# Patient Record
Sex: Male | Born: 1980 | Race: White | Hispanic: No | Marital: Single | Smoking: Never smoker
Health system: Southern US, Community
[De-identification: ages and names within clinical notes are randomized; demographics above are authoritative.]

---

## 2008-08-26 ENCOUNTER — Ambulatory Visit (HOSPITAL_COMMUNITY): Admission: RE | Admit: 2008-08-26 | Discharge: 2008-08-26 | Payer: Self-pay | Admitting: Sports Medicine

## 2016-08-25 DIAGNOSIS — F411 Generalized anxiety disorder: Secondary | ICD-10-CM | POA: Diagnosis not present

## 2016-09-27 DIAGNOSIS — Z6828 Body mass index (BMI) 28.0-28.9, adult: Secondary | ICD-10-CM | POA: Diagnosis not present

## 2016-09-27 DIAGNOSIS — R109 Unspecified abdominal pain: Secondary | ICD-10-CM | POA: Diagnosis not present

## 2016-09-27 DIAGNOSIS — R197 Diarrhea, unspecified: Secondary | ICD-10-CM | POA: Diagnosis not present

## 2016-09-28 ENCOUNTER — Other Ambulatory Visit: Payer: Self-pay | Admitting: Internal Medicine

## 2016-09-28 ENCOUNTER — Ambulatory Visit
Admission: RE | Admit: 2016-09-28 | Discharge: 2016-09-28 | Disposition: A | Discharge status: Home / Self Care | Payer: BLUE CROSS/BLUE SHIELD | Source: Ambulatory Visit | Attending: Internal Medicine | Admitting: Internal Medicine

## 2016-09-28 DIAGNOSIS — R109 Unspecified abdominal pain: Secondary | ICD-10-CM

## 2016-09-28 MED ORDER — IOPAMIDOL (ISOVUE-300) INJECTION 61%
100.0000 mL | Freq: Once | INTRAVENOUS | Status: AC | PRN
  Administered 2016-09-28: 100 mL via INTRAVENOUS

## 2016-10-05 DIAGNOSIS — M546 Pain in thoracic spine: Secondary | ICD-10-CM | POA: Diagnosis not present

## 2016-10-05 DIAGNOSIS — M9902 Segmental and somatic dysfunction of thoracic region: Secondary | ICD-10-CM | POA: Diagnosis not present

## 2016-10-05 DIAGNOSIS — M9901 Segmental and somatic dysfunction of cervical region: Secondary | ICD-10-CM | POA: Diagnosis not present

## 2016-10-05 DIAGNOSIS — M542 Cervicalgia: Secondary | ICD-10-CM | POA: Diagnosis not present

## 2016-10-18 ENCOUNTER — Encounter (HOSPITAL_COMMUNITY): Payer: Self-pay | Admitting: Emergency Medicine

## 2016-10-18 ENCOUNTER — Emergency Department (HOSPITAL_COMMUNITY): Payer: BLUE CROSS/BLUE SHIELD

## 2016-10-18 DIAGNOSIS — R0602 Shortness of breath: Secondary | ICD-10-CM | POA: Insufficient documentation

## 2016-10-18 DIAGNOSIS — R0789 Other chest pain: Secondary | ICD-10-CM | POA: Insufficient documentation

## 2016-10-18 DIAGNOSIS — R079 Chest pain, unspecified: Secondary | ICD-10-CM | POA: Diagnosis not present

## 2016-10-18 LAB — BASIC METABOLIC PANEL
Anion gap: 13 (ref 5–15)
BUN: 10 mg/dL (ref 6–20)
CHLORIDE: 104 mmol/L (ref 101–111)
CO2: 22 mmol/L (ref 22–32)
CREATININE: 1.02 mg/dL (ref 0.61–1.24)
Calcium: 9.5 mg/dL (ref 8.9–10.3)
GFR calc Af Amer: >60 mL/min (ref >60)
GFR calc non Af Amer: >60 mL/min (ref >60)
GLUCOSE: 100 mg/dL — AB (ref 65–99)
Potassium: 3.9 mmol/L (ref 3.5–5.1)
SODIUM: 139 mmol/L (ref 135–145)

## 2016-10-18 LAB — CBC
HCT: 46.9 % (ref 39.0–52.0)
Hemoglobin: 16.4 g/dL (ref 13.0–17.0)
MCH: 30.6 pg (ref 26.0–34.0)
MCHC: 35 g/dL (ref 30.0–36.0)
MCV: 87.5 fL (ref 78.0–100.0)
PLATELETS: 163 10*3/uL (ref 150–400)
RBC: 5.36 MIL/uL (ref 4.22–5.81)
RDW: 12.5 % (ref 11.5–15.5)
WBC: 8.5 10*3/uL (ref 4.0–10.5)

## 2016-10-18 LAB — I-STAT TROPONIN, ED: Troponin i, poc: 0.01 ng/mL (ref 0.00–0.08)

## 2016-10-18 NOTE — ED Triage Notes (Signed)
Patient with sudden onset of chest pain that started this evening, increasingly getting worse, with mild shortness of breath.  Patient denies any nausea or vomiting, describes the pain a sharp, stabbing.  Patient states that it started at rest.  Radiates to left arm.

## 2016-10-19 ENCOUNTER — Emergency Department (HOSPITAL_COMMUNITY)
Admission: EM | Admit: 2016-10-19 | Discharge: 2016-10-19 | Disposition: A | Discharge status: Home / Self Care | Payer: BLUE CROSS/BLUE SHIELD | Attending: Emergency Medicine | Admitting: Emergency Medicine

## 2016-10-19 DIAGNOSIS — R0789 Other chest pain: Secondary | ICD-10-CM

## 2016-10-19 LAB — I-STAT TROPONIN, ED: Troponin i, poc: 0 ng/mL (ref 0.00–0.08)

## 2016-10-19 MED ORDER — IBUPROFEN 600 MG PO TABS: 600.0000 mg | ORAL_TABLET | Freq: Four times a day (QID) | ORAL | 0 refills | Status: AC | PRN

## 2016-10-19 NOTE — ED Provider Notes (Signed)
[facility]-EMERGENCY DEPT Provider Note   CSN: [id number] Arrival date & time: 10/18/16  2246  By signing my name below, I, Diona BrownerJennifer Gorman, attest that this documentation has been prepared under the direction and in the presence of Derwood KaplanNanavati, Jorene Kaylor, MD. Electronically Signed: Diona BrownerJennifer Gorman, ED Scribe. 10/19/16. 4:28 AM.  History   Chief Complaint Chief Complaint  Patient presents with  . Chest Pain  . Shortness of Breath    HPI Donald Williams is a 36 y.o. male who presents to the Emergency Department complaining of sudden, gradually worsening, stabbing, left sided chest pain that started ~ 10 pm on 10/18/16. Pt was walking up the stairs when his pain started. Deep breathes exacerbate his discomfort. Fingers and toes are numb. Pain radiates to his left arm and neck. Pt currently rates pain as 3/10 but it was originally 8/10 severity when onset started. Has had this pain one time before. FHx of heart disease. No hx of blood clots. No recent travel. No recent injuries. Pt denies associated nausea, vomiting, diaphoresis, and SOB.  The history is provided by the patient. No language interpreter was used.    History reviewed. No pertinent past medical history.  There are no active problems to display for this patient.   History reviewed. No pertinent surgical history.     Home Medications    Prior to Admission medications   Medication Sig Start Date End Date Taking? Authorizing Provider  esomeprazole (NEXIUM) 40 MG capsule Take 40 mg by mouth daily at 12 noon.   Yes [provider]    Family History No family history on file.  Social History Social History  Substance Use Topics  . Smoking status: Never Smoker  . Smokeless tobacco: Never Used  . Alcohol use Yes     Allergies   Penicillins   Review of Systems Review of Systems  Constitutional: Negative for diaphoresis.  Respiratory: Negative for shortness of breath.   Cardiovascular: Positive for chest  pain.  Gastrointestinal: Negative for nausea and vomiting.  Musculoskeletal: Positive for arthralgias and neck pain.  Neurological: Positive for numbness.  All other systems reviewed and are negative.    Physical Exam Updated Vital Signs BP 109/65   Pulse (!) 57   Temp 98 F (36.7 C) (Oral)   Resp 15   SpO2 96%   Physical Exam  Constitutional: He is oriented to person, place, and time. He appears well-developed and well-nourished.  HENT:  Head: Normocephalic.  Eyes: EOM are normal.  Neck: Normal range of motion.  Cardiovascular: Normal rate, regular rhythm and normal heart sounds.   Pulmonary/Chest: Effort normal.  Lungs are clear to ausculation.   Abdominal: He exhibits no distension.  Musculoskeletal: Normal range of motion.  No bilateral leg edema. No calf swelling or tenderness.   Neurological: He is alert and oriented to person, place, and time.  Psychiatric: He has a normal mood and affect.  Nursing note and vitals reviewed.    ED Treatments / Results  DIAGNOSTIC STUDIES: Oxygen Saturation is 100% on RA, normal by my interpretation.   COORDINATION OF CARE: 4:28 AM-Discussed next steps with pt. Pt verbalized understanding and is agreeable with the plan.   Labs (all labs ordered are listed, but only abnormal results are displayed) Labs Reviewed  BASIC METABOLIC PANEL - Abnormal; Notable for the following:       Result Value   Glucose, Bld 100 (*)    All other components within normal limits  CBC  I-STAT TROPOININ, ED  Rosezena Sensor, ED    EKG  EKG Interpretation  Date/Time:  Tuesday October 18 2016 22:58:04 EDT Ventricular Rate:  76 PR Interval:  144 QRS Duration: 82 QT Interval:  360 QTC Calculation: 405 R Axis:   39 Text Interpretation:  Normal sinus rhythm Normal ECG No acute changes peaked T wave in v2 only -likely early repo TWI in lead III, no clear signs of right sided strain Confirmed by Derwood Kaplan [phone]) on 10/19/2016 4:56:03 AM        Radiology Dg Chest 2 View  Result Date: 10/18/2016 CLINICAL DATA:  36 year old male with chest pain. EXAM: CHEST  2 VIEW COMPARISON:  None. FINDINGS: The heart size and mediastinal contours are within normal limits. Both lungs are clear. The visualized skeletal structures are unremarkable. IMPRESSION: No active cardiopulmonary disease. Electronically Signed   By: Elgie Collard M.D.   On: 10/18/2016 23:47    Procedures Procedures (including critical care time)  Medications Ordered in ED Medications - No data to display   Initial Impression / Assessment and Plan / ED Course  I have reviewed the triage vital signs and the nursing notes.  Pertinent labs & imaging results that were available during my care of the patient were reviewed by me and considered in my medical decision making (see chart for details).  Clinical Course as of Oct 19 699  Wed Oct 19, 2016  0658 Pain now barely present. Results from the ER workup discussed with the patient face to face and all questions answered to the best of my ability. Strict ER return precautions have been discussed, and patient is agreeing with the plan and is comfortable with the workup done and the recommendations from the ER.   [AN]    Clinical Course User Index [AN] Derwood Kaplan, MD  Pt comes in with cc of chest pain. Chest pain is L sided, radiates to the neck. EKG is not showing any acute change. HEAR score is 1 - for hx. Pre-test probability for ACS is low.  Pain is also pleuritic. Pt is PERC neg.  Pain not worse when laying flat, and ekg shows no clear signs of pericarditis. CXR is neg. Deltra trop ordered - if neg, will dc.   Final Clinical Impressions(s) / ED Diagnoses   Final diagnoses:  Atypical chest pain    New Prescriptions New Prescriptions   No medications on file   I personally performed the services described in this documentation, which was scribed in my presence. The recorded information has been  reviewed and is accurate.     Derwood Kaplan, MD 10/19/16 [phone]

## 2016-10-19 NOTE — Discharge Instructions (Signed)
We saw you in the ER for the chest pain - shortness of breath. °All of our cardiac workup is normal, including labs, EKG and chest X-RAY are normal. °We are not sure what is causing your discomfort, but we feel comfortable sending you home at this time. The workup in the ER is not complete, and you should follow up with your primary care doctor for further evaluation. ° °Please return to the ER if you have worsening chest pain, shortness of breath, pain radiating to your jaw, shoulder, or back, sweats or fainting. Otherwise see your primary care doctor as requested. ° °

## 2016-10-20 DIAGNOSIS — Z8249 Family history of ischemic heart disease and other diseases of the circulatory system: Secondary | ICD-10-CM | POA: Diagnosis not present

## 2016-10-20 DIAGNOSIS — R0789 Other chest pain: Secondary | ICD-10-CM | POA: Diagnosis not present

## 2016-10-20 DIAGNOSIS — Z6828 Body mass index (BMI) 28.0-28.9, adult: Secondary | ICD-10-CM | POA: Diagnosis not present

## 2016-11-17 DIAGNOSIS — M9902 Segmental and somatic dysfunction of thoracic region: Secondary | ICD-10-CM | POA: Diagnosis not present

## 2016-11-17 DIAGNOSIS — M546 Pain in thoracic spine: Secondary | ICD-10-CM | POA: Diagnosis not present

## 2016-11-17 DIAGNOSIS — M542 Cervicalgia: Secondary | ICD-10-CM | POA: Diagnosis not present

## 2016-11-17 DIAGNOSIS — M9901 Segmental and somatic dysfunction of cervical region: Secondary | ICD-10-CM | POA: Diagnosis not present

## 2016-12-19 DIAGNOSIS — M9901 Segmental and somatic dysfunction of cervical region: Secondary | ICD-10-CM | POA: Diagnosis not present

## 2016-12-19 DIAGNOSIS — M546 Pain in thoracic spine: Secondary | ICD-10-CM | POA: Diagnosis not present

## 2016-12-19 DIAGNOSIS — M9902 Segmental and somatic dysfunction of thoracic region: Secondary | ICD-10-CM | POA: Diagnosis not present

## 2016-12-19 DIAGNOSIS — M542 Cervicalgia: Secondary | ICD-10-CM | POA: Diagnosis not present

## 2017-01-05 DIAGNOSIS — M546 Pain in thoracic spine: Secondary | ICD-10-CM | POA: Diagnosis not present

## 2017-01-05 DIAGNOSIS — M542 Cervicalgia: Secondary | ICD-10-CM | POA: Diagnosis not present

## 2017-01-05 DIAGNOSIS — M9902 Segmental and somatic dysfunction of thoracic region: Secondary | ICD-10-CM | POA: Diagnosis not present

## 2017-01-05 DIAGNOSIS — M9901 Segmental and somatic dysfunction of cervical region: Secondary | ICD-10-CM | POA: Diagnosis not present

## 2017-01-24 DIAGNOSIS — S61219A Laceration without foreign body of unspecified finger without damage to nail, initial encounter: Secondary | ICD-10-CM | POA: Diagnosis not present

## 2017-02-07 DIAGNOSIS — R7301 Impaired fasting glucose: Secondary | ICD-10-CM | POA: Diagnosis not present

## 2017-02-07 DIAGNOSIS — E7849 Other hyperlipidemia: Secondary | ICD-10-CM | POA: Diagnosis not present

## 2017-02-16 DIAGNOSIS — E7849 Other hyperlipidemia: Secondary | ICD-10-CM | POA: Diagnosis not present

## 2017-02-16 DIAGNOSIS — Z23 Encounter for immunization: Secondary | ICD-10-CM | POA: Diagnosis not present

## 2017-02-16 DIAGNOSIS — E8881 Metabolic syndrome: Secondary | ICD-10-CM | POA: Diagnosis not present

## 2017-02-16 DIAGNOSIS — Z8249 Family history of ischemic heart disease and other diseases of the circulatory system: Secondary | ICD-10-CM | POA: Diagnosis not present

## 2017-02-16 DIAGNOSIS — Z0000 Encounter for general adult medical examination without abnormal findings: Secondary | ICD-10-CM | POA: Diagnosis not present

## 2017-02-16 DIAGNOSIS — R7301 Impaired fasting glucose: Secondary | ICD-10-CM | POA: Diagnosis not present

## 2017-02-16 DIAGNOSIS — K219 Gastro-esophageal reflux disease without esophagitis: Secondary | ICD-10-CM | POA: Diagnosis not present

## 2017-05-29 DIAGNOSIS — M542 Cervicalgia: Secondary | ICD-10-CM | POA: Diagnosis not present

## 2017-05-29 DIAGNOSIS — M62838 Other muscle spasm: Secondary | ICD-10-CM | POA: Diagnosis not present

## 2017-05-29 DIAGNOSIS — M9902 Segmental and somatic dysfunction of thoracic region: Secondary | ICD-10-CM | POA: Diagnosis not present

## 2017-05-29 DIAGNOSIS — M9901 Segmental and somatic dysfunction of cervical region: Secondary | ICD-10-CM | POA: Diagnosis not present

## 2018-02-28 IMAGING — CT CT ABD-PELV W/ CM
1 of 2 series · 15 of 32 positions shown, 19 images · IV contrast (APPLIED)
Comparison: None.

CLINICAL DATA: Abdominal pain.

EXAM:
CT ABDOMEN AND PELVIS WITH CONTRAST
TECHNIQUE: Multidetector CT imaging of the abdomen and pelvis was performed
using the standard protocol following bolus administration of
intravenous contrast.
CONTRAST:  100mL ZRCUKC-1NN IOPAMIDOL (ZRCUKC-1NN) INJECTION 61%

[Series 2: abd/pelvis w/cm · axial · 0.69mm/px · z∈[-409,+76]mm · 15 of 107 slices shown, 19 images]
[im 5/107  soft-tissue]
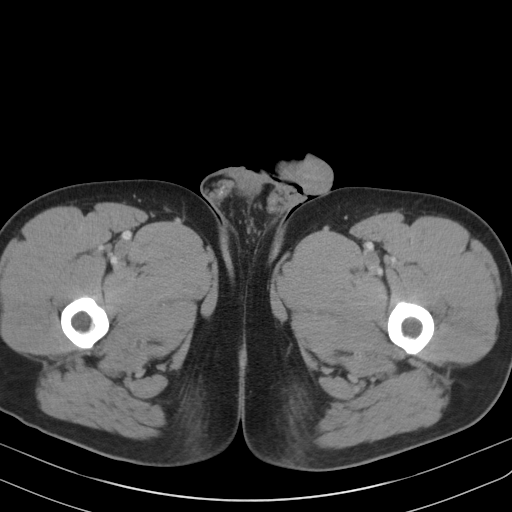
[im 5/107  bone]
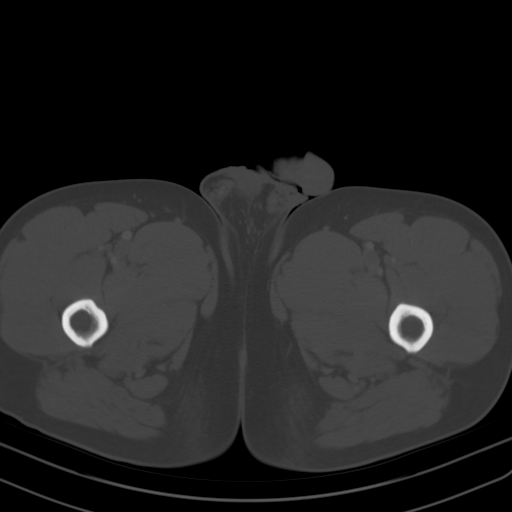
[im 14/107  soft-tissue]
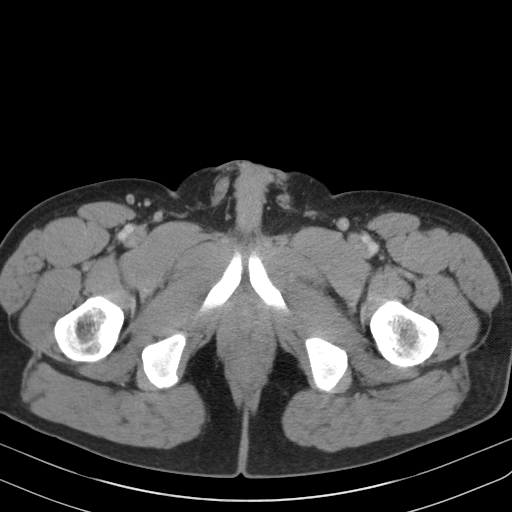
[im 23/107  soft-tissue]
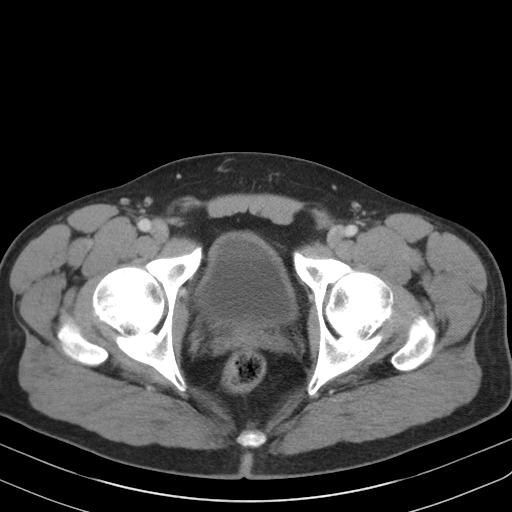
[im 31/107  soft-tissue]
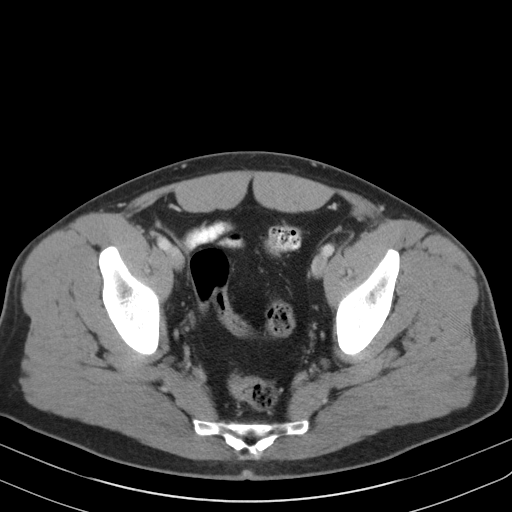
[im 36/107  soft-tissue]
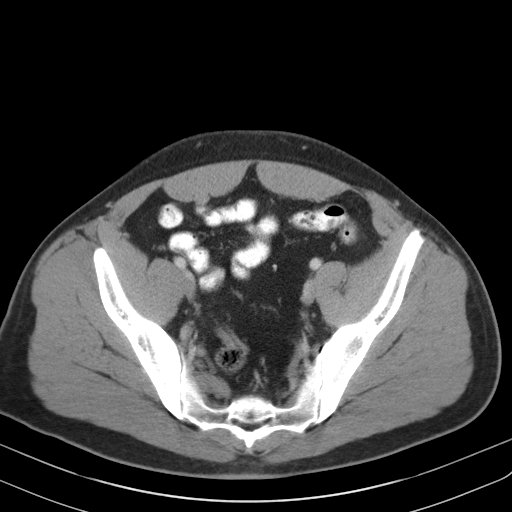
[im 45/107  soft-tissue]
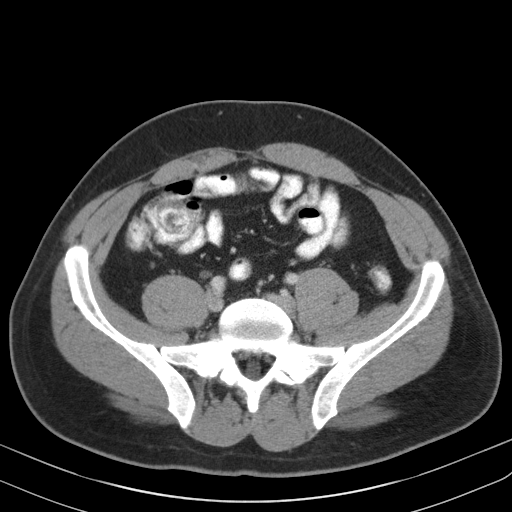
[im 54/107  soft-tissue]
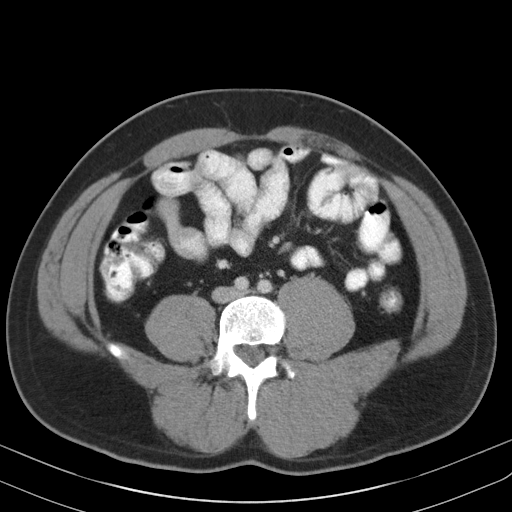
[im 62/107  soft-tissue]
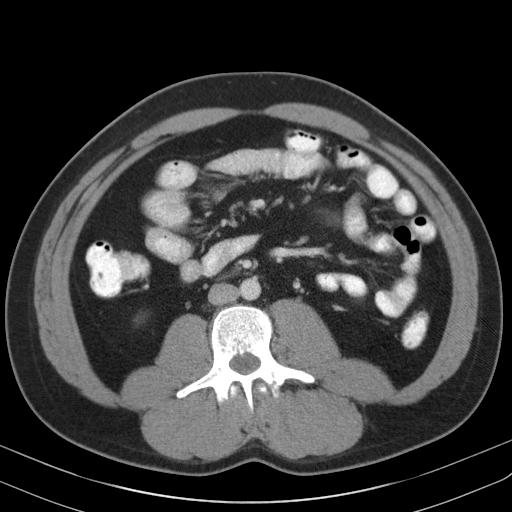
[im 71/107  soft-tissue]
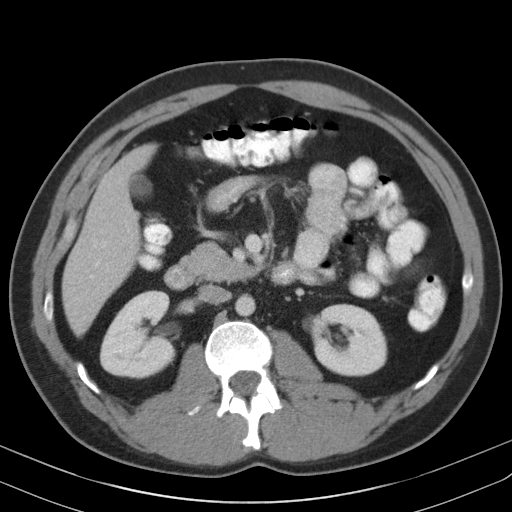
[im 71/107  bone]
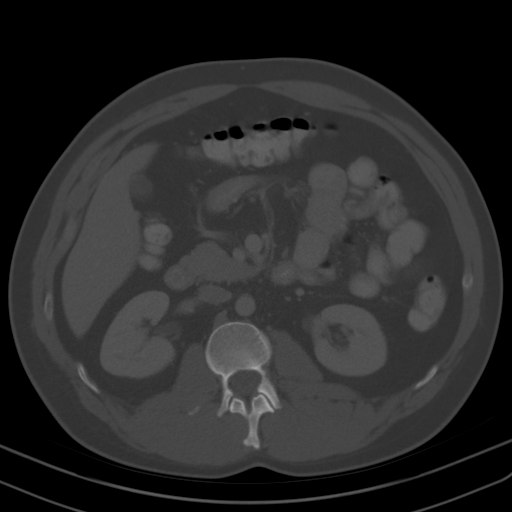
[im 76/107  soft-tissue]
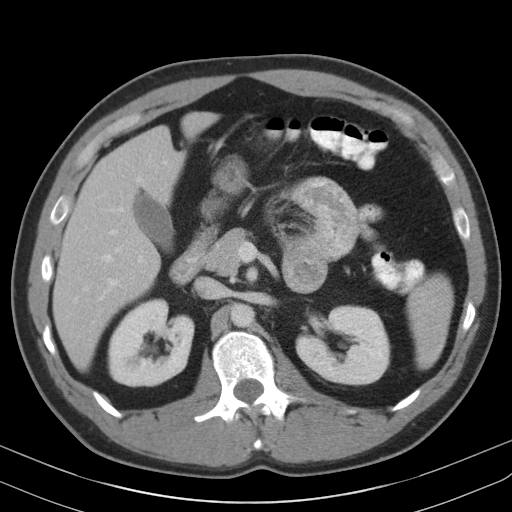
[im 84/107  soft-tissue]
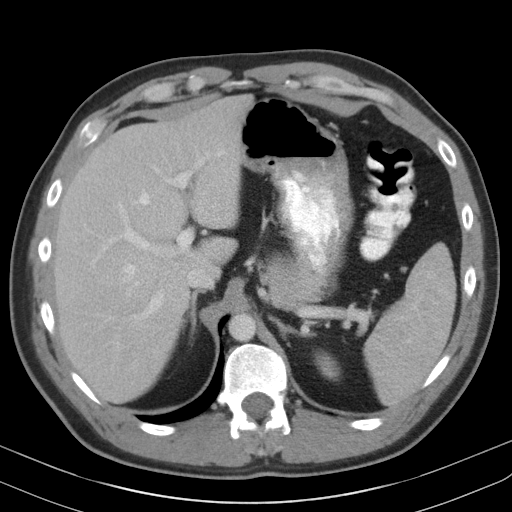
[im 89/107  lung]
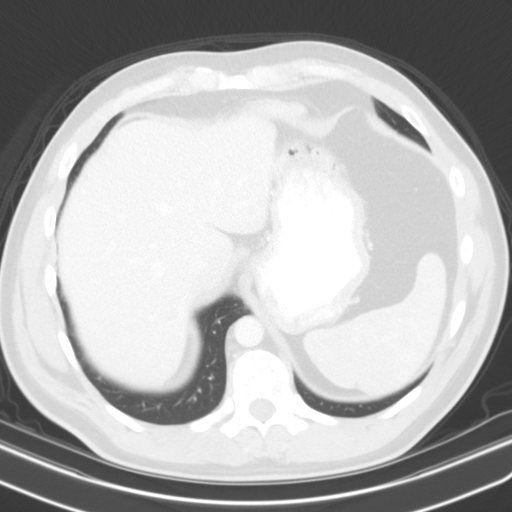
[im 93/107  soft-tissue]
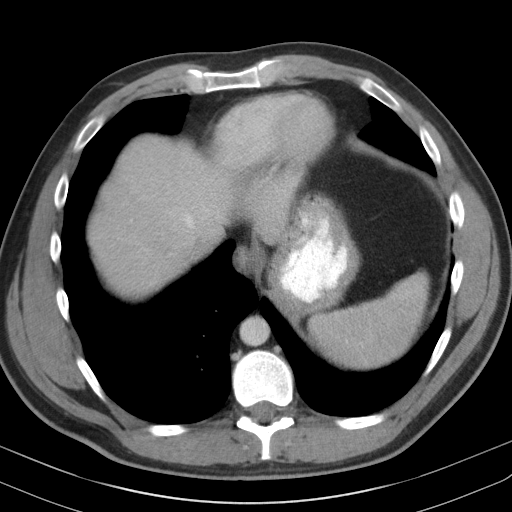
[im 93/107  lung]
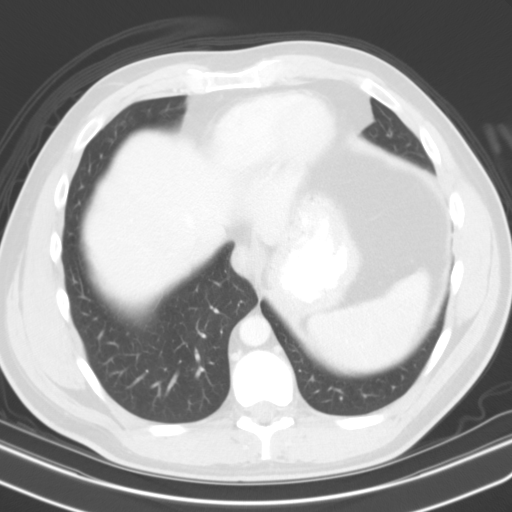
[im 98/107  lung]
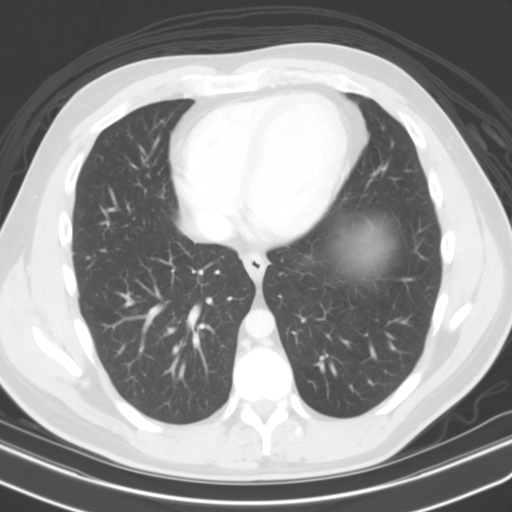
[im 102/107  soft-tissue]
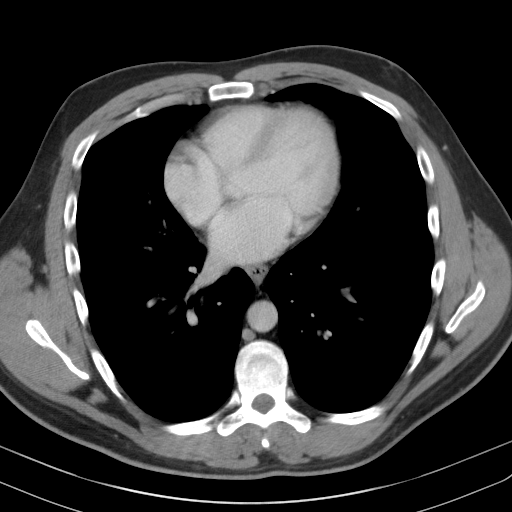
[im 102/107  lung]
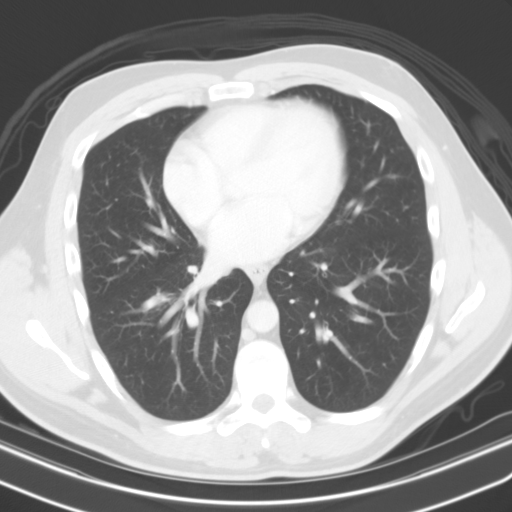

[15 of 32 positions shown; findings below may reference images not displayed]

FINDINGS: Lower chest: No acute abnormality.

Hepatobiliary: No focal liver abnormality is seen. No gallstones,
gallbladder wall thickening, or biliary dilatation.

Pancreas: Unremarkable. No pancreatic ductal dilatation or
surrounding inflammatory changes.

Spleen: Normal in size without focal abnormality.

Adrenals/Urinary Tract: Adrenal glands are unremarkable. Kidneys are
normal, without renal calculi, focal lesion, or hydronephrosis.
There is a thin urachal remnant identified along the anterior and
superior aspect of the bladder which does not appear thickened or
inflamed and is not associated with a mass. This is a normal
variant.

Stomach/Bowel: Bowel shows no evidence of inflammation or
obstruction. The appendix appears normal. No free air identified.

Vascular/Lymphatic: No significant vascular findings are present. No
enlarged abdominal or pelvic lymph nodes.

Reproductive: Prostate is unremarkable.

Other: No abdominal wall hernia or abnormality. No abdominopelvic
ascites. No focal abscess identified.

Musculoskeletal: No acute or significant osseous findings.
IMPRESSION: No acute process identified in the abdomen or pelvis. Incidental
thin urachal remnant attached to bladder.

## 2018-03-20 IMAGING — DX DG CHEST 2V
2 series · 2 of 2 positions shown · non-contrast
Comparison: None.

CLINICAL DATA: 36-year-old male with chest pain.

EXAM:
CHEST  2 VIEW

[chest pa]
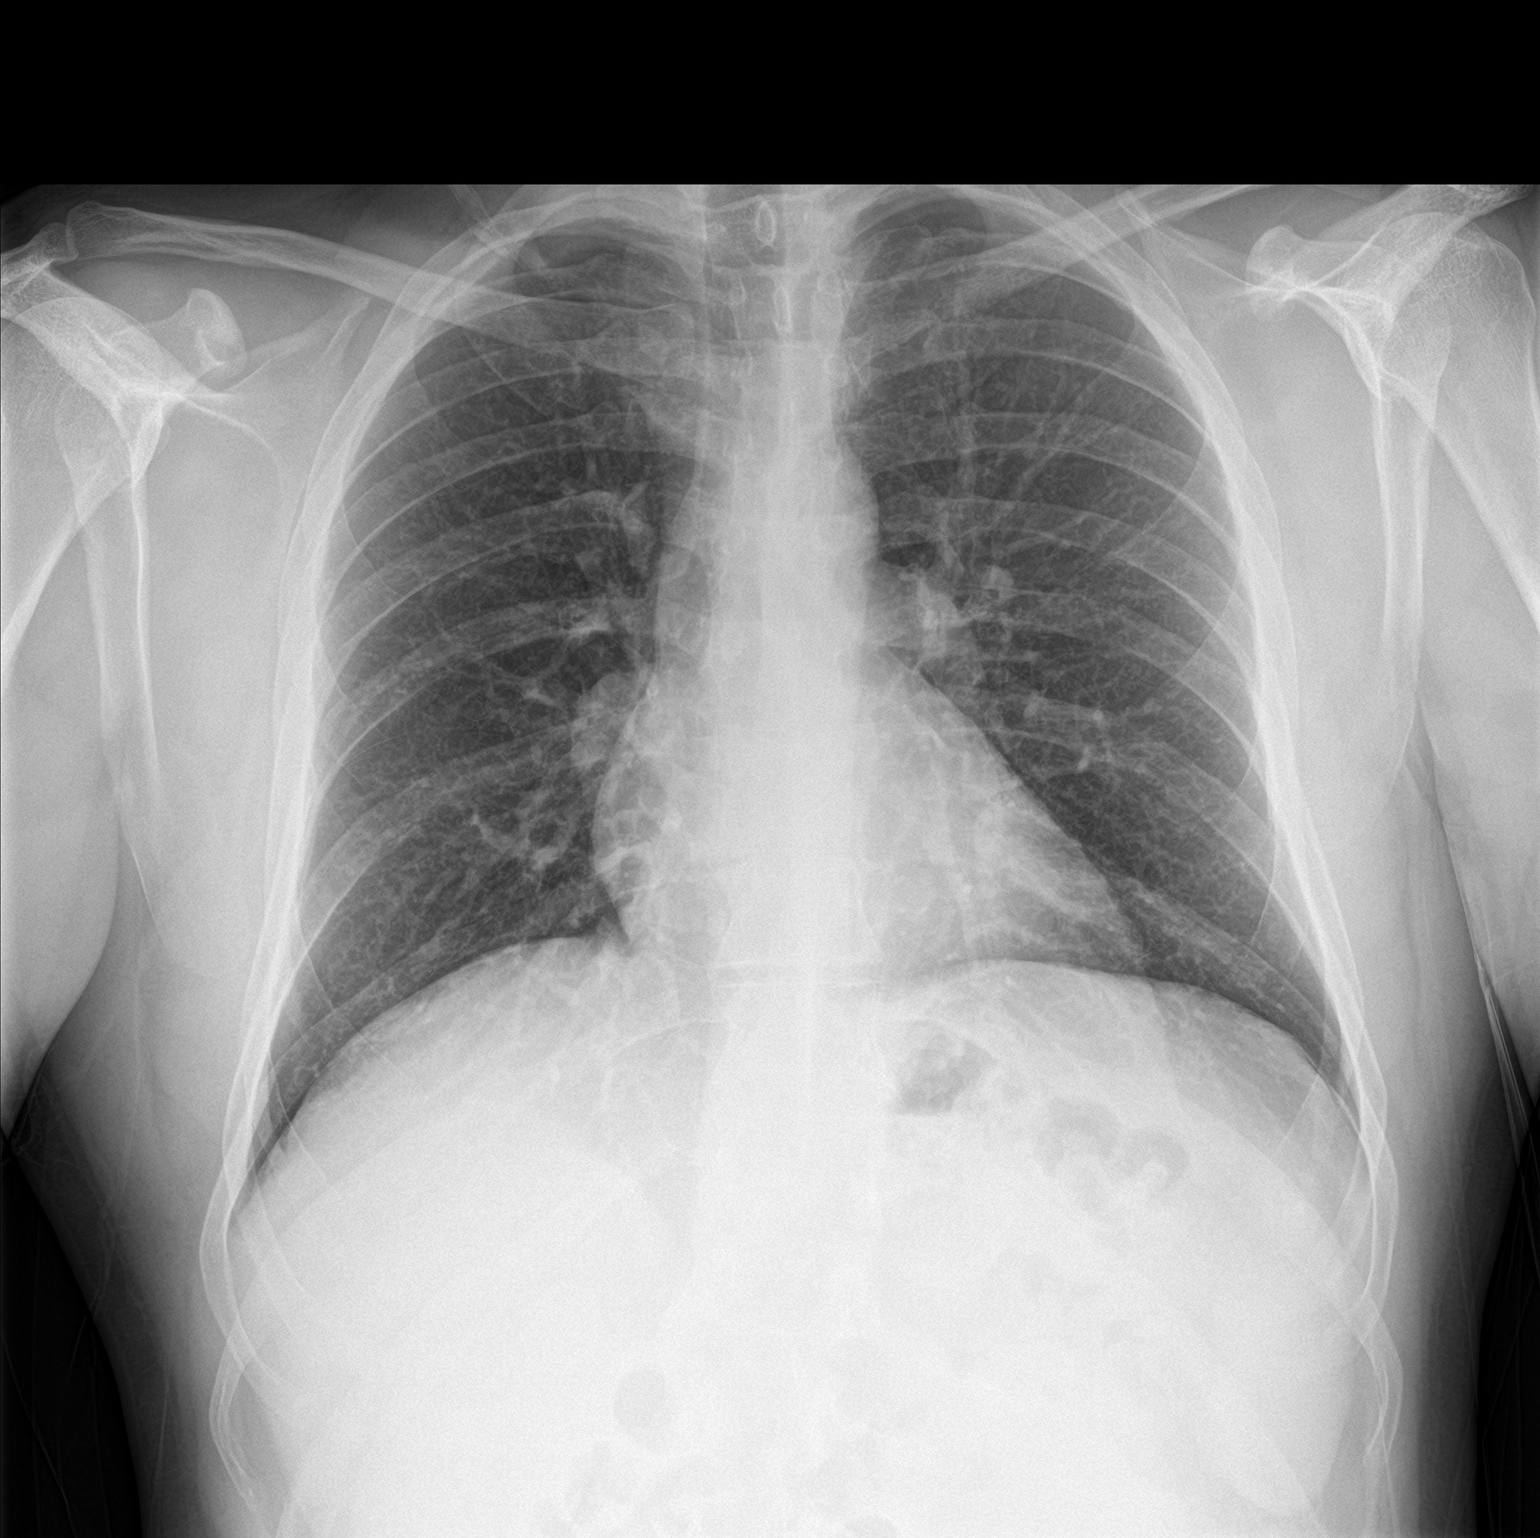

[chest lat]
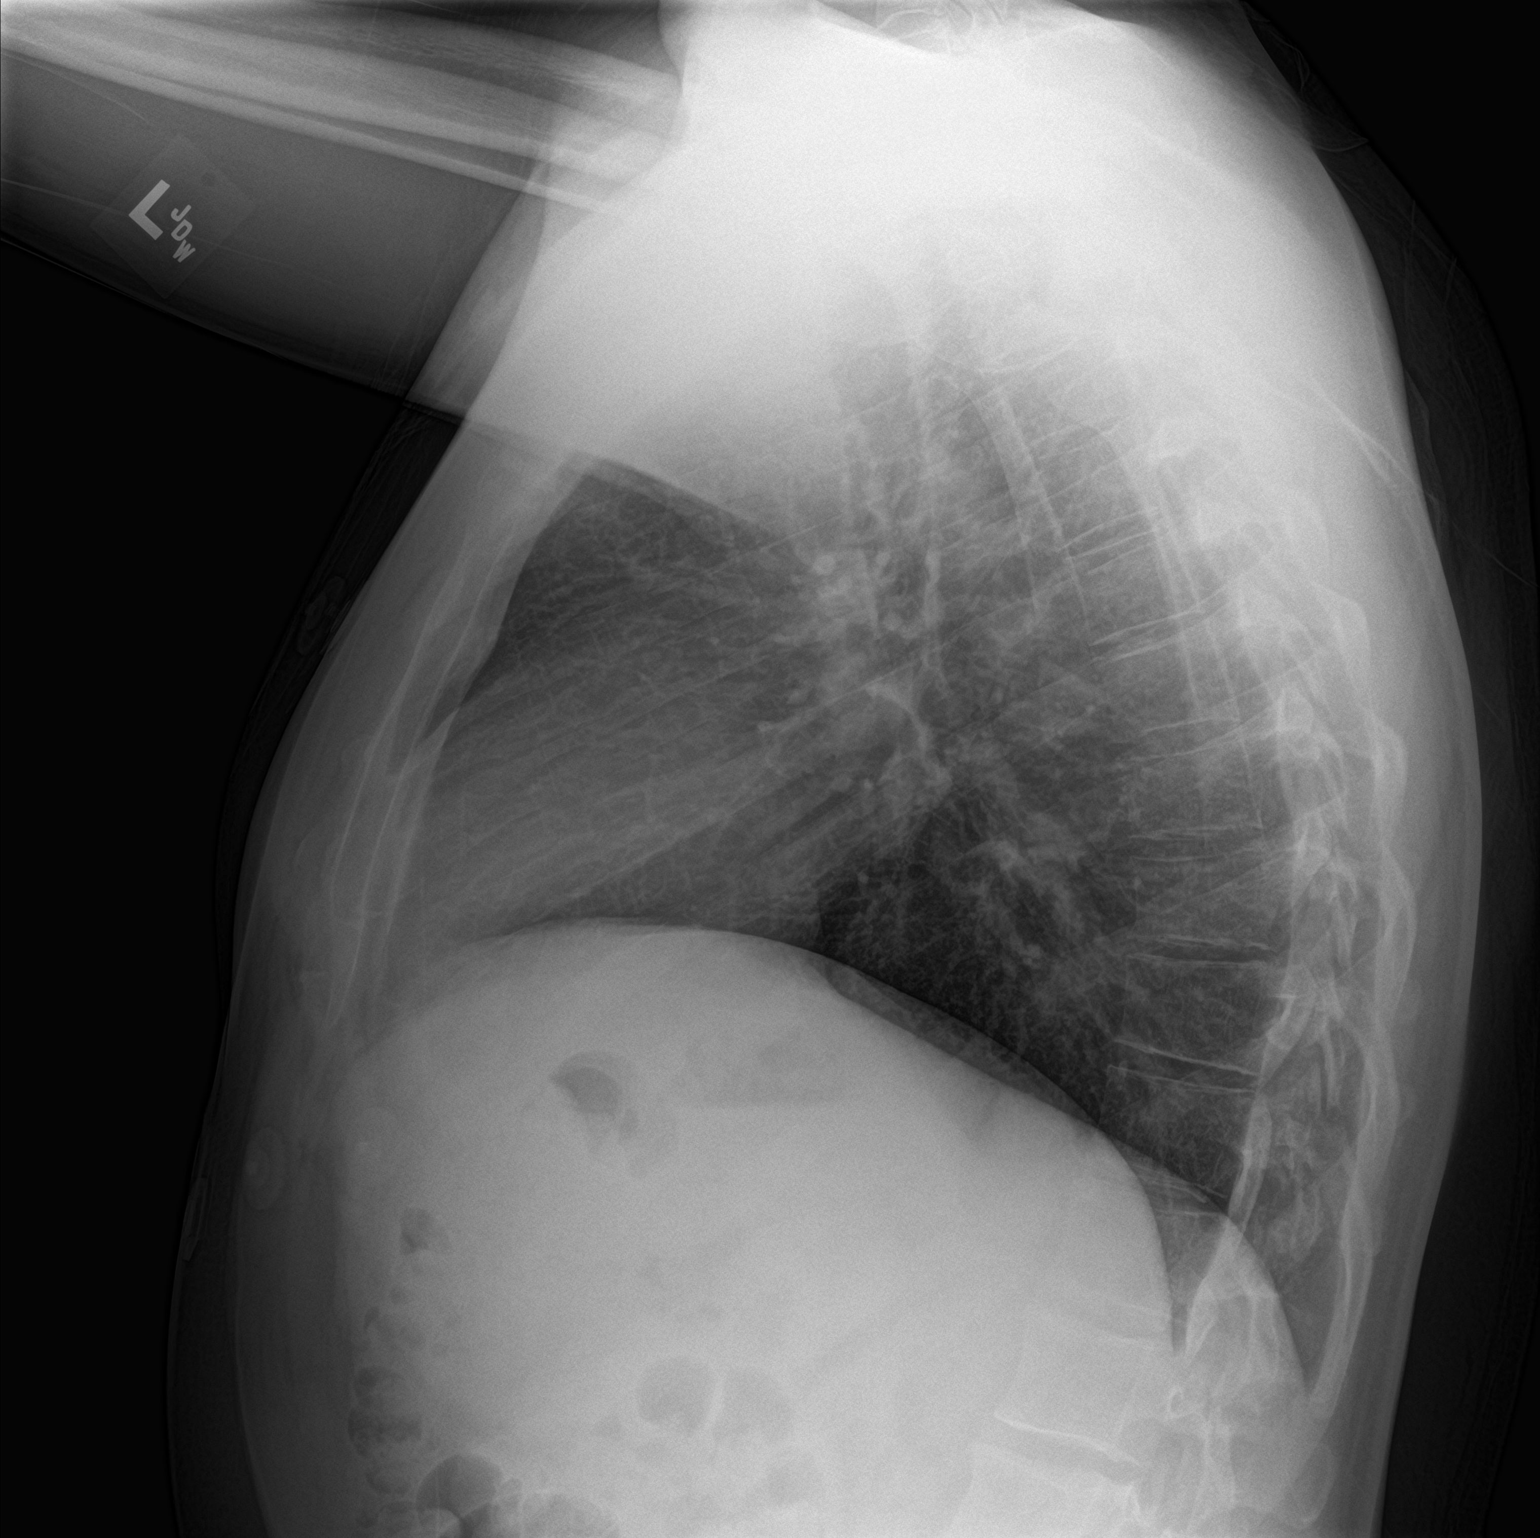

[2 of 2 positions shown; findings below may reference images not displayed]

FINDINGS: The heart size and mediastinal contours are within normal limits.
Both lungs are clear. The visualized skeletal structures are
unremarkable.
IMPRESSION: No active cardiopulmonary disease.

## 2018-05-09 DIAGNOSIS — M9902 Segmental and somatic dysfunction of thoracic region: Secondary | ICD-10-CM | POA: Diagnosis not present

## 2018-05-09 DIAGNOSIS — M9901 Segmental and somatic dysfunction of cervical region: Secondary | ICD-10-CM | POA: Diagnosis not present

## 2018-05-09 DIAGNOSIS — M542 Cervicalgia: Secondary | ICD-10-CM | POA: Diagnosis not present

## 2018-05-09 DIAGNOSIS — M546 Pain in thoracic spine: Secondary | ICD-10-CM | POA: Diagnosis not present

## 2018-07-09 DIAGNOSIS — M9902 Segmental and somatic dysfunction of thoracic region: Secondary | ICD-10-CM | POA: Diagnosis not present

## 2018-07-09 DIAGNOSIS — M545 Low back pain: Secondary | ICD-10-CM | POA: Diagnosis not present

## 2018-07-09 DIAGNOSIS — M9903 Segmental and somatic dysfunction of lumbar region: Secondary | ICD-10-CM | POA: Diagnosis not present

## 2018-07-09 DIAGNOSIS — M546 Pain in thoracic spine: Secondary | ICD-10-CM | POA: Diagnosis not present

## 2018-11-26 DIAGNOSIS — M9901 Segmental and somatic dysfunction of cervical region: Secondary | ICD-10-CM | POA: Diagnosis not present

## 2018-11-26 DIAGNOSIS — M9902 Segmental and somatic dysfunction of thoracic region: Secondary | ICD-10-CM | POA: Diagnosis not present

## 2018-11-26 DIAGNOSIS — M546 Pain in thoracic spine: Secondary | ICD-10-CM | POA: Diagnosis not present

## 2018-11-26 DIAGNOSIS — M545 Low back pain: Secondary | ICD-10-CM | POA: Diagnosis not present

## 2018-12-06 DIAGNOSIS — M9903 Segmental and somatic dysfunction of lumbar region: Secondary | ICD-10-CM | POA: Diagnosis not present

## 2018-12-06 DIAGNOSIS — M9902 Segmental and somatic dysfunction of thoracic region: Secondary | ICD-10-CM | POA: Diagnosis not present

## 2018-12-06 DIAGNOSIS — M546 Pain in thoracic spine: Secondary | ICD-10-CM | POA: Diagnosis not present

## 2018-12-06 DIAGNOSIS — M545 Low back pain: Secondary | ICD-10-CM | POA: Diagnosis not present

## 2019-02-28 DIAGNOSIS — E7849 Other hyperlipidemia: Secondary | ICD-10-CM | POA: Diagnosis not present

## 2019-02-28 DIAGNOSIS — Z0000 Encounter for general adult medical examination without abnormal findings: Secondary | ICD-10-CM | POA: Diagnosis not present

## 2019-02-28 DIAGNOSIS — R7301 Impaired fasting glucose: Secondary | ICD-10-CM | POA: Diagnosis not present

## 2019-03-07 DIAGNOSIS — Z8249 Family history of ischemic heart disease and other diseases of the circulatory system: Secondary | ICD-10-CM | POA: Diagnosis not present

## 2019-03-07 DIAGNOSIS — R7301 Impaired fasting glucose: Secondary | ICD-10-CM | POA: Diagnosis not present

## 2019-03-07 DIAGNOSIS — K219 Gastro-esophageal reflux disease without esophagitis: Secondary | ICD-10-CM | POA: Diagnosis not present

## 2019-03-07 DIAGNOSIS — Z1331 Encounter for screening for depression: Secondary | ICD-10-CM | POA: Diagnosis not present

## 2019-03-07 DIAGNOSIS — Z23 Encounter for immunization: Secondary | ICD-10-CM | POA: Diagnosis not present

## 2019-03-07 DIAGNOSIS — E785 Hyperlipidemia, unspecified: Secondary | ICD-10-CM | POA: Diagnosis not present

## 2019-03-07 DIAGNOSIS — Z0000 Encounter for general adult medical examination without abnormal findings: Secondary | ICD-10-CM | POA: Diagnosis not present

## 2019-03-14 DIAGNOSIS — M9901 Segmental and somatic dysfunction of cervical region: Secondary | ICD-10-CM | POA: Diagnosis not present

## 2019-03-14 DIAGNOSIS — M546 Pain in thoracic spine: Secondary | ICD-10-CM | POA: Diagnosis not present

## 2019-03-14 DIAGNOSIS — M9902 Segmental and somatic dysfunction of thoracic region: Secondary | ICD-10-CM | POA: Diagnosis not present

## 2019-03-14 DIAGNOSIS — M62838 Other muscle spasm: Secondary | ICD-10-CM | POA: Diagnosis not present

## 2019-03-26 ENCOUNTER — Other Ambulatory Visit: Payer: Self-pay

## 2019-03-26 DIAGNOSIS — Z20822 Contact with and (suspected) exposure to covid-19: Secondary | ICD-10-CM

## 2019-03-28 LAB — NOVEL CORONAVIRUS, NAA: SARS-CoV-2, NAA: NOT DETECTED

## 2019-04-18 DIAGNOSIS — M9902 Segmental and somatic dysfunction of thoracic region: Secondary | ICD-10-CM | POA: Diagnosis not present

## 2019-04-18 DIAGNOSIS — M9901 Segmental and somatic dysfunction of cervical region: Secondary | ICD-10-CM | POA: Diagnosis not present

## 2019-04-18 DIAGNOSIS — M62838 Other muscle spasm: Secondary | ICD-10-CM | POA: Diagnosis not present

## 2019-04-18 DIAGNOSIS — M546 Pain in thoracic spine: Secondary | ICD-10-CM | POA: Diagnosis not present

## 2019-06-19 DIAGNOSIS — M9902 Segmental and somatic dysfunction of thoracic region: Secondary | ICD-10-CM | POA: Diagnosis not present

## 2019-06-19 DIAGNOSIS — M545 Low back pain: Secondary | ICD-10-CM | POA: Diagnosis not present

## 2019-06-19 DIAGNOSIS — M9903 Segmental and somatic dysfunction of lumbar region: Secondary | ICD-10-CM | POA: Diagnosis not present

## 2019-06-19 DIAGNOSIS — M546 Pain in thoracic spine: Secondary | ICD-10-CM | POA: Diagnosis not present

## 2019-09-16 DIAGNOSIS — M542 Cervicalgia: Secondary | ICD-10-CM | POA: Diagnosis not present

## 2019-09-16 DIAGNOSIS — M9901 Segmental and somatic dysfunction of cervical region: Secondary | ICD-10-CM | POA: Diagnosis not present

## 2019-09-16 DIAGNOSIS — M546 Pain in thoracic spine: Secondary | ICD-10-CM | POA: Diagnosis not present

## 2019-09-16 DIAGNOSIS — M545 Low back pain: Secondary | ICD-10-CM | POA: Diagnosis not present

## 2019-11-27 DIAGNOSIS — M9902 Segmental and somatic dysfunction of thoracic region: Secondary | ICD-10-CM | POA: Diagnosis not present

## 2019-11-27 DIAGNOSIS — M546 Pain in thoracic spine: Secondary | ICD-10-CM | POA: Diagnosis not present

## 2019-11-27 DIAGNOSIS — M545 Low back pain: Secondary | ICD-10-CM | POA: Diagnosis not present

## 2019-11-27 DIAGNOSIS — M9903 Segmental and somatic dysfunction of lumbar region: Secondary | ICD-10-CM | POA: Diagnosis not present

## 2019-12-11 DIAGNOSIS — M25571 Pain in right ankle and joints of right foot: Secondary | ICD-10-CM | POA: Diagnosis not present

## 2019-12-26 DIAGNOSIS — M9902 Segmental and somatic dysfunction of thoracic region: Secondary | ICD-10-CM | POA: Diagnosis not present

## 2019-12-26 DIAGNOSIS — M545 Low back pain: Secondary | ICD-10-CM | POA: Diagnosis not present

## 2019-12-26 DIAGNOSIS — M9903 Segmental and somatic dysfunction of lumbar region: Secondary | ICD-10-CM | POA: Diagnosis not present

## 2019-12-26 DIAGNOSIS — M546 Pain in thoracic spine: Secondary | ICD-10-CM | POA: Diagnosis not present

## 2019-12-27 ENCOUNTER — Other Ambulatory Visit: Payer: Self-pay

## 2020-01-22 DIAGNOSIS — M546 Pain in thoracic spine: Secondary | ICD-10-CM | POA: Diagnosis not present

## 2020-01-22 DIAGNOSIS — M545 Low back pain: Secondary | ICD-10-CM | POA: Diagnosis not present

## 2020-01-22 DIAGNOSIS — M9902 Segmental and somatic dysfunction of thoracic region: Secondary | ICD-10-CM | POA: Diagnosis not present

## 2020-01-22 DIAGNOSIS — M9903 Segmental and somatic dysfunction of lumbar region: Secondary | ICD-10-CM | POA: Diagnosis not present

## 2020-03-02 DIAGNOSIS — R7301 Impaired fasting glucose: Secondary | ICD-10-CM | POA: Diagnosis not present

## 2020-03-02 DIAGNOSIS — Z0000 Encounter for general adult medical examination without abnormal findings: Secondary | ICD-10-CM | POA: Diagnosis not present

## 2020-03-02 DIAGNOSIS — E785 Hyperlipidemia, unspecified: Secondary | ICD-10-CM | POA: Diagnosis not present

## 2020-03-09 DIAGNOSIS — Z1389 Encounter for screening for other disorder: Secondary | ICD-10-CM | POA: Diagnosis not present

## 2020-03-09 DIAGNOSIS — E785 Hyperlipidemia, unspecified: Secondary | ICD-10-CM | POA: Diagnosis not present

## 2020-03-09 DIAGNOSIS — Z0000 Encounter for general adult medical examination without abnormal findings: Secondary | ICD-10-CM | POA: Diagnosis not present

## 2020-03-09 DIAGNOSIS — Z1331 Encounter for screening for depression: Secondary | ICD-10-CM | POA: Diagnosis not present

## 2020-03-09 DIAGNOSIS — Z23 Encounter for immunization: Secondary | ICD-10-CM | POA: Diagnosis not present

## 2020-03-18 DIAGNOSIS — M9903 Segmental and somatic dysfunction of lumbar region: Secondary | ICD-10-CM | POA: Diagnosis not present

## 2020-03-18 DIAGNOSIS — M9904 Segmental and somatic dysfunction of sacral region: Secondary | ICD-10-CM | POA: Diagnosis not present

## 2020-03-18 DIAGNOSIS — M9905 Segmental and somatic dysfunction of pelvic region: Secondary | ICD-10-CM | POA: Diagnosis not present

## 2020-03-18 DIAGNOSIS — M545 Low back pain, unspecified: Secondary | ICD-10-CM | POA: Diagnosis not present

## 2020-03-25 DIAGNOSIS — Z3009 Encounter for other general counseling and advice on contraception: Secondary | ICD-10-CM | POA: Diagnosis not present

## 2020-03-31 ENCOUNTER — Ambulatory Visit: Payer: Self-pay | Attending: Internal Medicine

## 2020-03-31 DIAGNOSIS — Z23 Encounter for immunization: Secondary | ICD-10-CM

## 2020-03-31 NOTE — Progress Notes (Signed)
   Covid-19 Vaccination Clinic  Name:  Donald Williams    MRN: [id number] DOB: 10/23/1980  03/31/2020  [name] was observed post Covid-19 immunization for 15 minutes without incident. He was provided with Vaccine Information Sheet and instruction to access the V-Safe system.   [name] was instructed to call [phone] with any severe reactions post vaccine: Marland Kitchen Difficulty breathing  . Swelling of face and throat  . A fast heartbeat  . A bad rash all over body  . Dizziness and weakness   Immunizations Administered    No immunizations on file.

## 2020-05-06 DIAGNOSIS — M9902 Segmental and somatic dysfunction of thoracic region: Secondary | ICD-10-CM | POA: Diagnosis not present

## 2020-05-06 DIAGNOSIS — M62838 Other muscle spasm: Secondary | ICD-10-CM | POA: Diagnosis not present

## 2020-05-06 DIAGNOSIS — M546 Pain in thoracic spine: Secondary | ICD-10-CM | POA: Diagnosis not present

## 2020-05-08 DIAGNOSIS — Z302 Encounter for sterilization: Secondary | ICD-10-CM | POA: Diagnosis not present

## 2020-08-03 DIAGNOSIS — M546 Pain in thoracic spine: Secondary | ICD-10-CM | POA: Diagnosis not present

## 2020-08-03 DIAGNOSIS — M9901 Segmental and somatic dysfunction of cervical region: Secondary | ICD-10-CM | POA: Diagnosis not present

## 2020-08-03 DIAGNOSIS — M62838 Other muscle spasm: Secondary | ICD-10-CM | POA: Diagnosis not present

## 2020-08-03 DIAGNOSIS — M9902 Segmental and somatic dysfunction of thoracic region: Secondary | ICD-10-CM | POA: Diagnosis not present

## 2020-08-31 DIAGNOSIS — M9901 Segmental and somatic dysfunction of cervical region: Secondary | ICD-10-CM | POA: Diagnosis not present

## 2020-08-31 DIAGNOSIS — M62838 Other muscle spasm: Secondary | ICD-10-CM | POA: Diagnosis not present

## 2020-08-31 DIAGNOSIS — M546 Pain in thoracic spine: Secondary | ICD-10-CM | POA: Diagnosis not present

## 2020-08-31 DIAGNOSIS — M9902 Segmental and somatic dysfunction of thoracic region: Secondary | ICD-10-CM | POA: Diagnosis not present

## 2020-09-29 DIAGNOSIS — M9901 Segmental and somatic dysfunction of cervical region: Secondary | ICD-10-CM | POA: Diagnosis not present

## 2020-09-29 DIAGNOSIS — M62838 Other muscle spasm: Secondary | ICD-10-CM | POA: Diagnosis not present

## 2020-09-29 DIAGNOSIS — M9902 Segmental and somatic dysfunction of thoracic region: Secondary | ICD-10-CM | POA: Diagnosis not present

## 2020-09-29 DIAGNOSIS — M546 Pain in thoracic spine: Secondary | ICD-10-CM | POA: Diagnosis not present

## 2020-10-20 DIAGNOSIS — M545 Low back pain, unspecified: Secondary | ICD-10-CM | POA: Diagnosis not present

## 2020-10-20 DIAGNOSIS — M9904 Segmental and somatic dysfunction of sacral region: Secondary | ICD-10-CM | POA: Diagnosis not present

## 2020-10-20 DIAGNOSIS — M9903 Segmental and somatic dysfunction of lumbar region: Secondary | ICD-10-CM | POA: Diagnosis not present

## 2020-10-20 DIAGNOSIS — M9902 Segmental and somatic dysfunction of thoracic region: Secondary | ICD-10-CM | POA: Diagnosis not present

## 2020-11-09 DIAGNOSIS — M9903 Segmental and somatic dysfunction of lumbar region: Secondary | ICD-10-CM | POA: Diagnosis not present

## 2020-11-09 DIAGNOSIS — M9902 Segmental and somatic dysfunction of thoracic region: Secondary | ICD-10-CM | POA: Diagnosis not present

## 2020-11-09 DIAGNOSIS — M545 Low back pain, unspecified: Secondary | ICD-10-CM | POA: Diagnosis not present

## 2020-11-09 DIAGNOSIS — M9904 Segmental and somatic dysfunction of sacral region: Secondary | ICD-10-CM | POA: Diagnosis not present

## 2020-11-23 DIAGNOSIS — M9904 Segmental and somatic dysfunction of sacral region: Secondary | ICD-10-CM | POA: Diagnosis not present

## 2020-11-23 DIAGNOSIS — M9902 Segmental and somatic dysfunction of thoracic region: Secondary | ICD-10-CM | POA: Diagnosis not present

## 2020-11-23 DIAGNOSIS — M545 Low back pain, unspecified: Secondary | ICD-10-CM | POA: Diagnosis not present

## 2020-11-23 DIAGNOSIS — M9903 Segmental and somatic dysfunction of lumbar region: Secondary | ICD-10-CM | POA: Diagnosis not present

## 2020-12-07 DIAGNOSIS — M545 Low back pain, unspecified: Secondary | ICD-10-CM | POA: Diagnosis not present

## 2020-12-07 DIAGNOSIS — M9904 Segmental and somatic dysfunction of sacral region: Secondary | ICD-10-CM | POA: Diagnosis not present

## 2020-12-07 DIAGNOSIS — M9903 Segmental and somatic dysfunction of lumbar region: Secondary | ICD-10-CM | POA: Diagnosis not present

## 2020-12-07 DIAGNOSIS — M9902 Segmental and somatic dysfunction of thoracic region: Secondary | ICD-10-CM | POA: Diagnosis not present

## 2020-12-15 DIAGNOSIS — M9902 Segmental and somatic dysfunction of thoracic region: Secondary | ICD-10-CM | POA: Diagnosis not present

## 2020-12-15 DIAGNOSIS — M545 Low back pain, unspecified: Secondary | ICD-10-CM | POA: Diagnosis not present

## 2020-12-15 DIAGNOSIS — M9904 Segmental and somatic dysfunction of sacral region: Secondary | ICD-10-CM | POA: Diagnosis not present

## 2020-12-15 DIAGNOSIS — M9903 Segmental and somatic dysfunction of lumbar region: Secondary | ICD-10-CM | POA: Diagnosis not present

## 2020-12-24 DIAGNOSIS — M9904 Segmental and somatic dysfunction of sacral region: Secondary | ICD-10-CM | POA: Diagnosis not present

## 2020-12-24 DIAGNOSIS — M9905 Segmental and somatic dysfunction of pelvic region: Secondary | ICD-10-CM | POA: Diagnosis not present

## 2020-12-24 DIAGNOSIS — M9902 Segmental and somatic dysfunction of thoracic region: Secondary | ICD-10-CM | POA: Diagnosis not present

## 2020-12-24 DIAGNOSIS — M9903 Segmental and somatic dysfunction of lumbar region: Secondary | ICD-10-CM | POA: Diagnosis not present

## 2021-01-05 DIAGNOSIS — M9904 Segmental and somatic dysfunction of sacral region: Secondary | ICD-10-CM | POA: Diagnosis not present

## 2021-01-05 DIAGNOSIS — M9902 Segmental and somatic dysfunction of thoracic region: Secondary | ICD-10-CM | POA: Diagnosis not present

## 2021-01-05 DIAGNOSIS — M9905 Segmental and somatic dysfunction of pelvic region: Secondary | ICD-10-CM | POA: Diagnosis not present

## 2021-01-05 DIAGNOSIS — M9903 Segmental and somatic dysfunction of lumbar region: Secondary | ICD-10-CM | POA: Diagnosis not present

## 2021-01-11 DIAGNOSIS — M9904 Segmental and somatic dysfunction of sacral region: Secondary | ICD-10-CM | POA: Diagnosis not present

## 2021-01-11 DIAGNOSIS — M9905 Segmental and somatic dysfunction of pelvic region: Secondary | ICD-10-CM | POA: Diagnosis not present

## 2021-01-11 DIAGNOSIS — M9902 Segmental and somatic dysfunction of thoracic region: Secondary | ICD-10-CM | POA: Diagnosis not present

## 2021-01-11 DIAGNOSIS — M9903 Segmental and somatic dysfunction of lumbar region: Secondary | ICD-10-CM | POA: Diagnosis not present

## 2021-01-18 DIAGNOSIS — M9904 Segmental and somatic dysfunction of sacral region: Secondary | ICD-10-CM | POA: Diagnosis not present

## 2021-01-18 DIAGNOSIS — M9903 Segmental and somatic dysfunction of lumbar region: Secondary | ICD-10-CM | POA: Diagnosis not present

## 2021-01-18 DIAGNOSIS — M9905 Segmental and somatic dysfunction of pelvic region: Secondary | ICD-10-CM | POA: Diagnosis not present

## 2021-01-18 DIAGNOSIS — M9902 Segmental and somatic dysfunction of thoracic region: Secondary | ICD-10-CM | POA: Diagnosis not present

## 2021-01-25 DIAGNOSIS — M9904 Segmental and somatic dysfunction of sacral region: Secondary | ICD-10-CM | POA: Diagnosis not present

## 2021-01-25 DIAGNOSIS — M9905 Segmental and somatic dysfunction of pelvic region: Secondary | ICD-10-CM | POA: Diagnosis not present

## 2021-01-25 DIAGNOSIS — M9903 Segmental and somatic dysfunction of lumbar region: Secondary | ICD-10-CM | POA: Diagnosis not present

## 2021-01-25 DIAGNOSIS — M9902 Segmental and somatic dysfunction of thoracic region: Secondary | ICD-10-CM | POA: Diagnosis not present

## 2021-02-01 DIAGNOSIS — M9902 Segmental and somatic dysfunction of thoracic region: Secondary | ICD-10-CM | POA: Diagnosis not present

## 2021-02-01 DIAGNOSIS — M9905 Segmental and somatic dysfunction of pelvic region: Secondary | ICD-10-CM | POA: Diagnosis not present

## 2021-02-01 DIAGNOSIS — M9904 Segmental and somatic dysfunction of sacral region: Secondary | ICD-10-CM | POA: Diagnosis not present

## 2021-02-01 DIAGNOSIS — M9903 Segmental and somatic dysfunction of lumbar region: Secondary | ICD-10-CM | POA: Diagnosis not present

## 2021-02-18 DIAGNOSIS — M542 Cervicalgia: Secondary | ICD-10-CM | POA: Diagnosis not present

## 2021-02-18 DIAGNOSIS — M545 Low back pain, unspecified: Secondary | ICD-10-CM | POA: Diagnosis not present

## 2021-02-18 DIAGNOSIS — M546 Pain in thoracic spine: Secondary | ICD-10-CM | POA: Diagnosis not present

## 2021-02-18 DIAGNOSIS — M9903 Segmental and somatic dysfunction of lumbar region: Secondary | ICD-10-CM | POA: Diagnosis not present

## 2021-03-29 DIAGNOSIS — Z125 Encounter for screening for malignant neoplasm of prostate: Secondary | ICD-10-CM | POA: Diagnosis not present

## 2021-03-29 DIAGNOSIS — R7301 Impaired fasting glucose: Secondary | ICD-10-CM | POA: Diagnosis not present

## 2021-03-29 DIAGNOSIS — E785 Hyperlipidemia, unspecified: Secondary | ICD-10-CM | POA: Diagnosis not present

## 2021-04-05 DIAGNOSIS — Z1339 Encounter for screening examination for other mental health and behavioral disorders: Secondary | ICD-10-CM | POA: Diagnosis not present

## 2021-04-05 DIAGNOSIS — Z23 Encounter for immunization: Secondary | ICD-10-CM | POA: Diagnosis not present

## 2021-04-05 DIAGNOSIS — R82998 Other abnormal findings in urine: Secondary | ICD-10-CM | POA: Diagnosis not present

## 2021-04-05 DIAGNOSIS — Z0000 Encounter for general adult medical examination without abnormal findings: Secondary | ICD-10-CM | POA: Diagnosis not present

## 2021-04-05 DIAGNOSIS — Z1331 Encounter for screening for depression: Secondary | ICD-10-CM | POA: Diagnosis not present

## 2021-04-05 DIAGNOSIS — E781 Pure hyperglyceridemia: Secondary | ICD-10-CM | POA: Diagnosis not present

## 2021-04-15 DIAGNOSIS — M62838 Other muscle spasm: Secondary | ICD-10-CM | POA: Diagnosis not present

## 2021-04-15 DIAGNOSIS — M9902 Segmental and somatic dysfunction of thoracic region: Secondary | ICD-10-CM | POA: Diagnosis not present

## 2021-04-15 DIAGNOSIS — M546 Pain in thoracic spine: Secondary | ICD-10-CM | POA: Diagnosis not present

## 2021-07-28 DIAGNOSIS — M9904 Segmental and somatic dysfunction of sacral region: Secondary | ICD-10-CM | POA: Diagnosis not present

## 2021-07-28 DIAGNOSIS — M9902 Segmental and somatic dysfunction of thoracic region: Secondary | ICD-10-CM | POA: Diagnosis not present

## 2021-07-28 DIAGNOSIS — M9903 Segmental and somatic dysfunction of lumbar region: Secondary | ICD-10-CM | POA: Diagnosis not present

## 2021-07-28 DIAGNOSIS — M9905 Segmental and somatic dysfunction of pelvic region: Secondary | ICD-10-CM | POA: Diagnosis not present

## 2021-08-24 DIAGNOSIS — M9904 Segmental and somatic dysfunction of sacral region: Secondary | ICD-10-CM | POA: Diagnosis not present

## 2021-08-24 DIAGNOSIS — M9902 Segmental and somatic dysfunction of thoracic region: Secondary | ICD-10-CM | POA: Diagnosis not present

## 2021-08-24 DIAGNOSIS — M9903 Segmental and somatic dysfunction of lumbar region: Secondary | ICD-10-CM | POA: Diagnosis not present

## 2021-08-24 DIAGNOSIS — M9905 Segmental and somatic dysfunction of pelvic region: Secondary | ICD-10-CM | POA: Diagnosis not present

## 2021-12-01 DIAGNOSIS — M546 Pain in thoracic spine: Secondary | ICD-10-CM | POA: Diagnosis not present

## 2021-12-01 DIAGNOSIS — M9902 Segmental and somatic dysfunction of thoracic region: Secondary | ICD-10-CM | POA: Diagnosis not present

## 2021-12-01 DIAGNOSIS — M62838 Other muscle spasm: Secondary | ICD-10-CM | POA: Diagnosis not present

## 2022-02-02 DIAGNOSIS — M25562 Pain in left knee: Secondary | ICD-10-CM | POA: Diagnosis not present

## 2022-02-16 DIAGNOSIS — M25562 Pain in left knee: Secondary | ICD-10-CM | POA: Diagnosis not present

## 2022-03-22 DIAGNOSIS — M9902 Segmental and somatic dysfunction of thoracic region: Secondary | ICD-10-CM | POA: Diagnosis not present

## 2022-03-22 DIAGNOSIS — M62838 Other muscle spasm: Secondary | ICD-10-CM | POA: Diagnosis not present

## 2022-03-22 DIAGNOSIS — M546 Pain in thoracic spine: Secondary | ICD-10-CM | POA: Diagnosis not present

## 2022-04-19 DIAGNOSIS — M62838 Other muscle spasm: Secondary | ICD-10-CM | POA: Diagnosis not present

## 2022-04-19 DIAGNOSIS — M546 Pain in thoracic spine: Secondary | ICD-10-CM | POA: Diagnosis not present

## 2022-04-19 DIAGNOSIS — M9902 Segmental and somatic dysfunction of thoracic region: Secondary | ICD-10-CM | POA: Diagnosis not present

## 2022-05-03 DIAGNOSIS — R7301 Impaired fasting glucose: Secondary | ICD-10-CM | POA: Diagnosis not present

## 2022-05-03 DIAGNOSIS — E785 Hyperlipidemia, unspecified: Secondary | ICD-10-CM | POA: Diagnosis not present

## 2022-05-03 DIAGNOSIS — Z125 Encounter for screening for malignant neoplasm of prostate: Secondary | ICD-10-CM | POA: Diagnosis not present

## 2022-05-09 DIAGNOSIS — E781 Pure hyperglyceridemia: Secondary | ICD-10-CM | POA: Diagnosis not present

## 2022-05-09 DIAGNOSIS — Z1331 Encounter for screening for depression: Secondary | ICD-10-CM | POA: Diagnosis not present

## 2022-05-09 DIAGNOSIS — Z0000 Encounter for general adult medical examination without abnormal findings: Secondary | ICD-10-CM | POA: Diagnosis not present

## 2022-05-09 DIAGNOSIS — Z1339 Encounter for screening examination for other mental health and behavioral disorders: Secondary | ICD-10-CM | POA: Diagnosis not present

## 2022-09-19 DIAGNOSIS — M62838 Other muscle spasm: Secondary | ICD-10-CM | POA: Diagnosis not present

## 2022-09-19 DIAGNOSIS — M9901 Segmental and somatic dysfunction of cervical region: Secondary | ICD-10-CM | POA: Diagnosis not present

## 2022-09-19 DIAGNOSIS — M546 Pain in thoracic spine: Secondary | ICD-10-CM | POA: Diagnosis not present

## 2022-09-19 DIAGNOSIS — M9902 Segmental and somatic dysfunction of thoracic region: Secondary | ICD-10-CM | POA: Diagnosis not present

## 2022-11-02 DIAGNOSIS — M9901 Segmental and somatic dysfunction of cervical region: Secondary | ICD-10-CM | POA: Diagnosis not present

## 2022-11-02 DIAGNOSIS — M9903 Segmental and somatic dysfunction of lumbar region: Secondary | ICD-10-CM | POA: Diagnosis not present

## 2022-11-02 DIAGNOSIS — M9902 Segmental and somatic dysfunction of thoracic region: Secondary | ICD-10-CM | POA: Diagnosis not present

## 2022-11-02 DIAGNOSIS — S838X2S Sprain of other specified parts of left knee, sequela: Secondary | ICD-10-CM | POA: Diagnosis not present

## 2022-11-02 DIAGNOSIS — M25462 Effusion, left knee: Secondary | ICD-10-CM | POA: Diagnosis not present

## 2022-11-02 DIAGNOSIS — M9906 Segmental and somatic dysfunction of lower extremity: Secondary | ICD-10-CM | POA: Diagnosis not present

## 2022-11-02 DIAGNOSIS — M25562 Pain in left knee: Secondary | ICD-10-CM | POA: Diagnosis not present

## 2022-11-02 DIAGNOSIS — M9905 Segmental and somatic dysfunction of pelvic region: Secondary | ICD-10-CM | POA: Diagnosis not present

## 2022-12-12 DIAGNOSIS — M25562 Pain in left knee: Secondary | ICD-10-CM | POA: Diagnosis not present

## 2022-12-12 DIAGNOSIS — M9906 Segmental and somatic dysfunction of lower extremity: Secondary | ICD-10-CM | POA: Diagnosis not present

## 2022-12-12 DIAGNOSIS — M9905 Segmental and somatic dysfunction of pelvic region: Secondary | ICD-10-CM | POA: Diagnosis not present

## 2022-12-12 DIAGNOSIS — S838X2S Sprain of other specified parts of left knee, sequela: Secondary | ICD-10-CM | POA: Diagnosis not present

## 2022-12-12 DIAGNOSIS — M9901 Segmental and somatic dysfunction of cervical region: Secondary | ICD-10-CM | POA: Diagnosis not present

## 2022-12-12 DIAGNOSIS — M9902 Segmental and somatic dysfunction of thoracic region: Secondary | ICD-10-CM | POA: Diagnosis not present

## 2022-12-12 DIAGNOSIS — M9903 Segmental and somatic dysfunction of lumbar region: Secondary | ICD-10-CM | POA: Diagnosis not present

## 2022-12-12 DIAGNOSIS — M25462 Effusion, left knee: Secondary | ICD-10-CM | POA: Diagnosis not present

## 2022-12-30 DIAGNOSIS — M9905 Segmental and somatic dysfunction of pelvic region: Secondary | ICD-10-CM | POA: Diagnosis not present

## 2022-12-30 DIAGNOSIS — M9903 Segmental and somatic dysfunction of lumbar region: Secondary | ICD-10-CM | POA: Diagnosis not present

## 2022-12-30 DIAGNOSIS — M25462 Effusion, left knee: Secondary | ICD-10-CM | POA: Diagnosis not present

## 2022-12-30 DIAGNOSIS — M25562 Pain in left knee: Secondary | ICD-10-CM | POA: Diagnosis not present

## 2022-12-30 DIAGNOSIS — M9901 Segmental and somatic dysfunction of cervical region: Secondary | ICD-10-CM | POA: Diagnosis not present

## 2022-12-30 DIAGNOSIS — M9902 Segmental and somatic dysfunction of thoracic region: Secondary | ICD-10-CM | POA: Diagnosis not present

## 2022-12-30 DIAGNOSIS — S838X2S Sprain of other specified parts of left knee, sequela: Secondary | ICD-10-CM | POA: Diagnosis not present

## 2022-12-30 DIAGNOSIS — M9906 Segmental and somatic dysfunction of lower extremity: Secondary | ICD-10-CM | POA: Diagnosis not present

## 2023-08-01 ENCOUNTER — Emergency Department (HOSPITAL_COMMUNITY)

## 2023-08-01 ENCOUNTER — Other Ambulatory Visit: Payer: Self-pay

## 2023-08-01 ENCOUNTER — Emergency Department (HOSPITAL_COMMUNITY)
Admission: EM | Admit: 2023-08-01 | Discharge: 2023-08-01 | Disposition: A | Discharge status: Home / Self Care | Attending: Emergency Medicine | Admitting: Emergency Medicine

## 2023-08-01 ENCOUNTER — Encounter (HOSPITAL_COMMUNITY): Payer: Self-pay | Admitting: Emergency Medicine

## 2023-08-01 DIAGNOSIS — Y92410 Unspecified street and highway as the place of occurrence of the external cause: Secondary | ICD-10-CM | POA: Insufficient documentation

## 2023-08-01 DIAGNOSIS — M546 Pain in thoracic spine: Secondary | ICD-10-CM | POA: Insufficient documentation

## 2023-08-01 DIAGNOSIS — S161XXA Strain of muscle, fascia and tendon at neck level, initial encounter: Secondary | ICD-10-CM | POA: Diagnosis not present

## 2023-08-01 DIAGNOSIS — S199XXA Unspecified injury of neck, initial encounter: Secondary | ICD-10-CM | POA: Diagnosis present

## 2023-08-01 DIAGNOSIS — R519 Headache, unspecified: Secondary | ICD-10-CM | POA: Diagnosis not present

## 2023-08-01 DIAGNOSIS — M7918 Myalgia, other site: Secondary | ICD-10-CM

## 2023-08-01 MED ORDER — OXYCODONE-ACETAMINOPHEN 5-325 MG PO TABS
1.0000 | ORAL_TABLET | Freq: Once | ORAL | Status: AC
  Administered 2023-08-01: 1 via ORAL
  Filled 2023-08-01: qty 1

## 2023-08-01 MED ORDER — METHOCARBAMOL 750 MG PO TABS: 750.0000 mg | ORAL_TABLET | Freq: Four times a day (QID) | ORAL | 0 refills | Status: AC

## 2023-08-01 MED ORDER — METHOCARBAMOL 500 MG PO TABS
750.0000 mg | ORAL_TABLET | Freq: Once | ORAL | Status: AC
  Administered 2023-08-01: 750 mg via ORAL
  Filled 2023-08-01: qty 2

## 2023-08-01 MED ORDER — OXYCODONE-ACETAMINOPHEN 5-325 MG PO TABS: 1.0000 | ORAL_TABLET | Freq: Four times a day (QID) | ORAL | 0 refills | Status: AC | PRN
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
## 2023-08-01 NOTE — Discharge Instructions (Signed)
 Continue taking ibuprofen every 6 hours (600 mg is recommended which is 3 of the over-the-counter strength tablets), Robaxin 2-4 times a day, and oxycodone if needed for severe pain.   If you develop worsening or concerning symptoms, see your doctor or return to the ED for re-evaluation.
